# Patient Record
Sex: Male | Born: 1985 | Hispanic: No | Marital: Single | Smoking: Current some day smoker
Health system: Southern US, Community
[De-identification: ages and names within clinical notes are randomized; demographics above are authoritative.]

---

## 2021-08-25 ENCOUNTER — Emergency Department (HOSPITAL_COMMUNITY): Payer: Medicaid - Out of State

## 2021-08-25 ENCOUNTER — Encounter (HOSPITAL_COMMUNITY): Payer: Self-pay

## 2021-08-25 ENCOUNTER — Other Ambulatory Visit: Payer: Self-pay

## 2021-08-25 ENCOUNTER — Emergency Department (HOSPITAL_COMMUNITY)
Admission: EM | Admit: 2021-08-25 | Discharge: 2021-08-25 | Disposition: A | Discharge status: Home / Self Care | Payer: Medicaid - Out of State | Attending: Emergency Medicine | Admitting: Emergency Medicine

## 2021-08-25 DIAGNOSIS — S0083XA Contusion of other part of head, initial encounter: Secondary | ICD-10-CM | POA: Diagnosis not present

## 2021-08-25 DIAGNOSIS — S40011A Contusion of right shoulder, initial encounter: Secondary | ICD-10-CM

## 2021-08-25 DIAGNOSIS — S40012A Contusion of left shoulder, initial encounter: Secondary | ICD-10-CM | POA: Diagnosis not present

## 2021-08-25 DIAGNOSIS — S0512XA Contusion of eyeball and orbital tissues, left eye, initial encounter: Secondary | ICD-10-CM

## 2021-08-25 DIAGNOSIS — S80812A Abrasion, left lower leg, initial encounter: Secondary | ICD-10-CM | POA: Diagnosis not present

## 2021-08-25 DIAGNOSIS — Y92009 Unspecified place in unspecified non-institutional (private) residence as the place of occurrence of the external cause: Secondary | ICD-10-CM | POA: Insufficient documentation

## 2021-08-25 DIAGNOSIS — S0990XA Unspecified injury of head, initial encounter: Secondary | ICD-10-CM | POA: Diagnosis present
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
## 2021-08-25 NOTE — ED Notes (Signed)
Visual Acuity:  Left Eye: 20/20 Right Eye: 20/25 Both: 20/20

## 2022-03-14 ENCOUNTER — Emergency Department (HOSPITAL_COMMUNITY): Payer: Medicaid Other

## 2022-03-14 ENCOUNTER — Emergency Department (HOSPITAL_COMMUNITY)
Admission: EM | Admit: 2022-03-14 | Discharge: 2022-03-14 | Disposition: A | Discharge status: Home / Self Care | Payer: Medicaid Other | Attending: Emergency Medicine | Admitting: Emergency Medicine

## 2022-03-14 ENCOUNTER — Encounter (HOSPITAL_COMMUNITY): Payer: Self-pay | Admitting: Emergency Medicine

## 2022-03-14 ENCOUNTER — Other Ambulatory Visit: Payer: Self-pay

## 2022-03-14 DIAGNOSIS — S81811A Laceration without foreign body, right lower leg, initial encounter: Secondary | ICD-10-CM | POA: Insufficient documentation

## 2022-03-14 DIAGNOSIS — S0101XA Laceration without foreign body of scalp, initial encounter: Secondary | ICD-10-CM | POA: Insufficient documentation

## 2022-03-14 DIAGNOSIS — W19XXXA Unspecified fall, initial encounter: Secondary | ICD-10-CM | POA: Insufficient documentation

## 2022-03-14 DIAGNOSIS — S41111A Laceration without foreign body of right upper arm, initial encounter: Secondary | ICD-10-CM | POA: Diagnosis not present

## 2022-03-14 DIAGNOSIS — S0990XA Unspecified injury of head, initial encounter: Secondary | ICD-10-CM

## 2022-03-14 DIAGNOSIS — S41112A Laceration without foreign body of left upper arm, initial encounter: Secondary | ICD-10-CM | POA: Diagnosis not present

## 2022-03-14 DIAGNOSIS — S81812A Laceration without foreign body, left lower leg, initial encounter: Secondary | ICD-10-CM | POA: Insufficient documentation

## 2022-03-14 NOTE — Discharge Instructions (Signed)
Scan today was reassuring, Tylenol Motrin for pain.  Wash the scratches on regular soap and water, vitamin E oil may help alleviate the scar and provide some relief.  Follow-up with primary if symptoms persist, return to ED if things change or worsen. ?
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
## 2022-08-14 ENCOUNTER — Emergency Department (HOSPITAL_COMMUNITY): Payer: Medicaid Other

## 2022-08-14 ENCOUNTER — Emergency Department (HOSPITAL_COMMUNITY)
Admission: EM | Admit: 2022-08-14 | Discharge: 2022-08-15 | Disposition: A | Discharge status: Home / Self Care | Payer: Medicaid Other | Attending: Student | Admitting: Student

## 2022-08-14 DIAGNOSIS — Y908 Blood alcohol level of 240 mg/100 ml or more: Secondary | ICD-10-CM | POA: Diagnosis not present

## 2022-08-14 DIAGNOSIS — Z23 Encounter for immunization: Secondary | ICD-10-CM | POA: Insufficient documentation

## 2022-08-14 DIAGNOSIS — S79921A Unspecified injury of right thigh, initial encounter: Secondary | ICD-10-CM | POA: Diagnosis present

## 2022-08-14 DIAGNOSIS — S71101A Unspecified open wound, right thigh, initial encounter: Secondary | ICD-10-CM | POA: Diagnosis not present

## 2022-08-14 DIAGNOSIS — F17210 Nicotine dependence, cigarettes, uncomplicated: Secondary | ICD-10-CM | POA: Insufficient documentation

## 2022-08-14 DIAGNOSIS — W3400XA Accidental discharge from unspecified firearms or gun, initial encounter: Secondary | ICD-10-CM | POA: Diagnosis not present

## 2022-08-14 DIAGNOSIS — S8011XA Contusion of right lower leg, initial encounter: Secondary | ICD-10-CM

## 2022-08-14 LAB — CBC
HCT: 36.6 % — ABNORMAL LOW (ref 39.0–52.0)
Hemoglobin: 12 g/dL — ABNORMAL LOW (ref 13.0–17.0)
MCH: 32.6 pg (ref 26.0–34.0)
MCHC: 32.8 g/dL (ref 30.0–36.0)
MCV: 99.5 fL (ref 80.0–100.0)
Platelets: 229 10*3/uL (ref 150–400)
RBC: 3.68 MIL/uL — ABNORMAL LOW (ref 4.22–5.81)
RDW: 12.8 % (ref 11.5–15.5)
WBC: 8.3 10*3/uL (ref 4.0–10.5)
nRBC: 0 % (ref 0.0–0.2)

## 2022-08-14 LAB — COMPREHENSIVE METABOLIC PANEL
ALT: 16 U/L (ref 0–44)
AST: 26 U/L (ref 15–41)
Albumin: 3.3 g/dL — ABNORMAL LOW (ref 3.5–5.0)
Alkaline Phosphatase: 48 U/L (ref 38–126)
Anion gap: 9 (ref 5–15)
BUN: 8 mg/dL (ref 6–20)
CO2: 16 mmol/L — ABNORMAL LOW (ref 22–32)
Calcium: 7.2 mg/dL — ABNORMAL LOW (ref 8.9–10.3)
Chloride: 117 mmol/L — ABNORMAL HIGH (ref 98–111)
Creatinine, Ser: 0.75 mg/dL (ref 0.61–1.24)
GFR, Estimated: >60 mL/min (ref >60)
Glucose, Bld: 94 mg/dL (ref 70–99)
Potassium: 3.1 mmol/L — ABNORMAL LOW (ref 3.5–5.1)
Sodium: 142 mmol/L (ref 135–145)
Total Bilirubin: 0.7 mg/dL (ref 0.3–1.2)
Total Protein: 5.5 g/dL — ABNORMAL LOW (ref 6.5–8.1)

## 2022-08-14 LAB — I-STAT CHEM 8, ED
BUN: 6 mg/dL (ref 6–20)
Calcium, Ion: 0.87 mmol/L — CL (ref 1.15–1.40)
Chloride: 114 mmol/L — ABNORMAL HIGH (ref 98–111)
Creatinine, Ser: 1 mg/dL (ref 0.61–1.24)
Glucose, Bld: 83 mg/dL (ref 70–99)
HCT: 35 % — ABNORMAL LOW (ref 39.0–52.0)
Hemoglobin: 11.9 g/dL — ABNORMAL LOW (ref 13.0–17.0)
Potassium: 2.9 mmol/L — ABNORMAL LOW (ref 3.5–5.1)
Sodium: 146 mmol/L — ABNORMAL HIGH (ref 135–145)
TCO2: 15 mmol/L — ABNORMAL LOW (ref 22–32)

## 2022-08-14 LAB — SAMPLE TO BLOOD BANK

## 2022-08-14 LAB — ETHANOL: Alcohol, Ethyl (B): 286 mg/dL — ABNORMAL HIGH (ref <10)

## 2022-08-14 MED ORDER — IOHEXOL 350 MG/ML SOLN
100.0000 mL | Freq: Once | INTRAVENOUS | Status: AC | PRN
  Administered 2022-08-14: 100 mL via INTRAVENOUS

## 2022-08-14 MED ORDER — CALCIUM GLUCONATE-NACL 1-0.675 GM/50ML-% IV SOLN
1.0000 g | Freq: Once | INTRAVENOUS | Status: AC
  Administered 2022-08-14: 1000 mg via INTRAVENOUS
  Filled 2022-08-14: qty 50

## 2022-08-14 MED ORDER — TETANUS-DIPHTH-ACELL PERTUSSIS 5-2.5-18.5 LF-MCG/0.5 IM SUSY
0.5000 mL | PREFILLED_SYRINGE | Freq: Once | INTRAMUSCULAR | Status: AC
  Administered 2022-08-14: 0.5 mL via INTRAMUSCULAR

## 2022-08-14 MED ORDER — LACTATED RINGERS IV BOLUS
1000.0000 mL | Freq: Once | INTRAVENOUS | Status: AC
  Administered 2022-08-14: 1000 mL via INTRAVENOUS

## 2022-08-14 MED ORDER — LORAZEPAM 2 MG/ML IJ SOLN
INTRAMUSCULAR | Status: AC
  Administered 2022-08-14: 2 mg
  Filled 2022-08-14: qty 1

## 2022-08-14 MED ORDER — ZIPRASIDONE MESYLATE 20 MG IM SOLR
20.0000 mg | Freq: Once | INTRAMUSCULAR | Status: AC
  Administered 2022-08-14: 20 mg via INTRAMUSCULAR

## 2022-08-14 MED ORDER — POTASSIUM CHLORIDE CRYS ER 20 MEQ PO TBCR
40.0000 meq | EXTENDED_RELEASE_TABLET | Freq: Once | ORAL | Status: AC
  Administered 2022-08-15: 40 meq via ORAL
  Filled 2022-08-14: qty 2
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
## 2022-08-14 NOTE — ED Notes (Signed)
Pt refused CT scan

## 2022-08-14 NOTE — ED Notes (Signed)
The pt has not been in restraints since I took over at [employer]
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
## 2022-08-14 NOTE — ED Notes (Signed)
Pt transported to CT with this RN and Evangeline Gula.

## 2022-08-14 NOTE — ED Notes (Signed)
Critical Lab results given to EDP
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
## 2022-08-14 NOTE — ED Notes (Signed)
The pt remains sleeping unless disturbed
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
## 2022-08-14 NOTE — Progress Notes (Signed)
Transition of Care [facility]) - CAGE-AID Screening   Patient Details  Name: Karl Newton MRN: [id number] Date of Birth: November 23, 1986     Hewitt Shorts, RN Trauma Response Nurse Phone Number: [phone] 08/14/2022, 5:30 PM   Clinical Narrative:    CAGE-AID Screening: Substance Abuse Screening unable to be completed due to: : Patient unable to participate (pt has refused to answer initially, then required medication-- sleeping at present)  Have You Ever Felt You Ought to Cut Down on Your Drinking or Drug Use?: No Have People Annoyed You By Critizing Your Drinking Or Drug Use?: No Have You Felt Bad Or Guilty About Your Drinking Or Drug Use?: No Have You Ever Had a Drink or Used Drugs First Thing In The Morning to Steady Your Nerves or to Get Rid of a Hangover?: No CAGE-AID Score: 0

## 2022-08-14 NOTE — ED Notes (Signed)
Pt refused CT- began swearing at staff, trying to get up off stretcher-- returned to treatment room. Dr. Audrie Lia made aware.

## 2022-08-15 MED ORDER — ACETAMINOPHEN 325 MG PO TABS
650.0000 mg | ORAL_TABLET | Freq: Once | ORAL | Status: AC
  Administered 2022-08-15: 650 mg via ORAL
  Filled 2022-08-15: qty 2
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
## 2022-09-08 ENCOUNTER — Encounter (HOSPITAL_COMMUNITY): Payer: Self-pay | Admitting: Emergency Medicine

## 2022-09-08 ENCOUNTER — Emergency Department (HOSPITAL_COMMUNITY)
Admission: EM | Admit: 2022-09-08 | Discharge: 2022-09-09 | Discharge status: Against Medical Advice | Payer: Medicaid Other | Attending: Emergency Medicine | Admitting: Emergency Medicine

## 2022-09-08 ENCOUNTER — Other Ambulatory Visit: Payer: Self-pay

## 2022-09-08 DIAGNOSIS — Z5321 Procedure and treatment not carried out due to patient leaving prior to being seen by health care provider: Secondary | ICD-10-CM | POA: Diagnosis not present

## 2022-09-08 DIAGNOSIS — W3400XA Accidental discharge from unspecified firearms or gun, initial encounter: Secondary | ICD-10-CM | POA: Diagnosis not present

## 2022-09-08 DIAGNOSIS — T148XXA Other injury of unspecified body region, initial encounter: Secondary | ICD-10-CM | POA: Insufficient documentation

## 2022-09-08 LAB — COMPREHENSIVE METABOLIC PANEL
ALT: 17 U/L (ref 0–44)
AST: 24 U/L (ref 15–41)
Albumin: 4 g/dL (ref 3.5–5.0)
Alkaline Phosphatase: 69 U/L (ref 38–126)
Anion gap: 9 (ref 5–15)
BUN: 16 mg/dL (ref 6–20)
CO2: 26 mmol/L (ref 22–32)
Calcium: 9.4 mg/dL (ref 8.9–10.3)
Chloride: 104 mmol/L (ref 98–111)
Creatinine, Ser: 0.93 mg/dL (ref 0.61–1.24)
GFR, Estimated: >60 mL/min (ref >60)
Glucose, Bld: 122 mg/dL — ABNORMAL HIGH (ref 70–99)
Potassium: 4.1 mmol/L (ref 3.5–5.1)
Sodium: 139 mmol/L (ref 135–145)
Total Bilirubin: 0.6 mg/dL (ref 0.3–1.2)
Total Protein: 6.6 g/dL (ref 6.5–8.1)

## 2022-09-08 LAB — CBC
HCT: 38.1 % — ABNORMAL LOW (ref 39.0–52.0)
Hemoglobin: 12.7 g/dL — ABNORMAL LOW (ref 13.0–17.0)
MCH: 33.2 pg (ref 26.0–34.0)
MCHC: 33.3 g/dL (ref 30.0–36.0)
MCV: 99.5 fL (ref 80.0–100.0)
Platelets: 285 10*3/uL (ref 150–400)
RBC: 3.83 MIL/uL — ABNORMAL LOW (ref 4.22–5.81)
RDW: 12.7 % (ref 11.5–15.5)
WBC: 9.1 10*3/uL (ref 4.0–10.5)
nRBC: 0 % (ref 0.0–0.2)
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
## 2022-09-08 NOTE — ED Notes (Signed)
Pt called back for quick dispo again with no response.

## 2022-09-08 NOTE — ED Notes (Signed)
Pt called for quick dispo with no response.  
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
## 2022-10-18 IMAGING — CT CT HEAD W/O CM
3 series · 15 of 47 positions shown, 18 images · non-contrast
Comparison: None.

CLINICAL DATA: Status post fall.



[Series 2: head wo · axial · 0.49mm/px · z∈[-56,+89]mm · 9 of 35 slices shown, 12 images]
[im 3/35  brain]
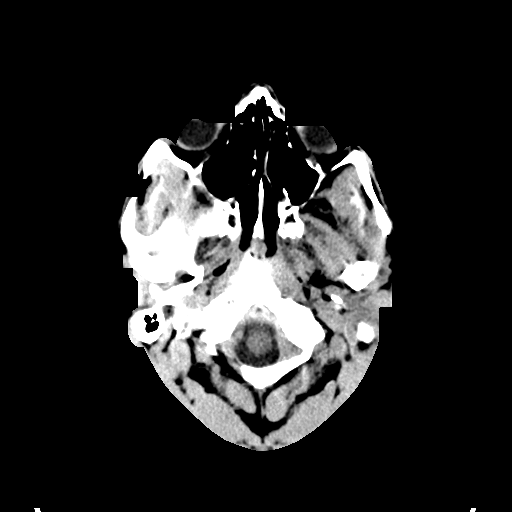
[im 3/35  bone]
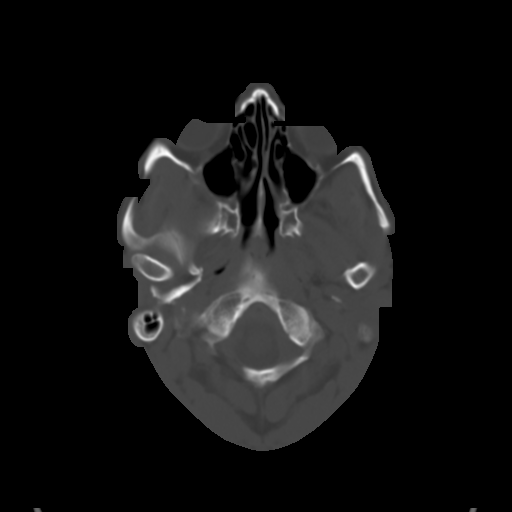
[im 6/35  brain]
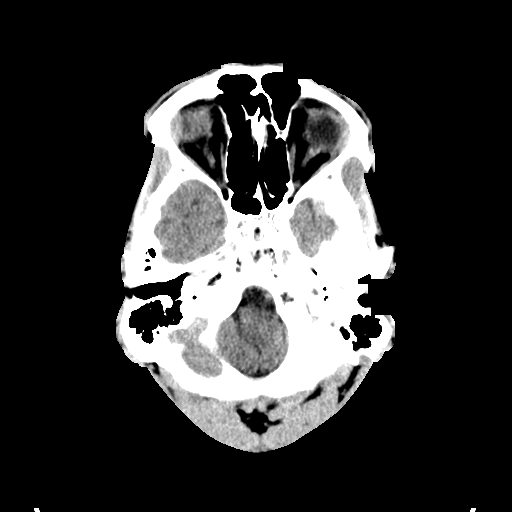
[im 10/35  brain]
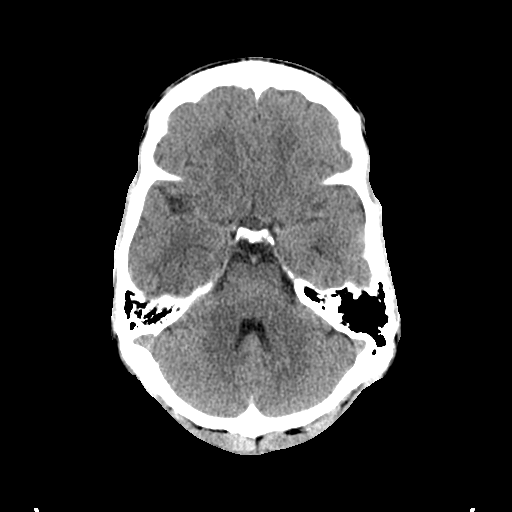
[im 13/35  brain]
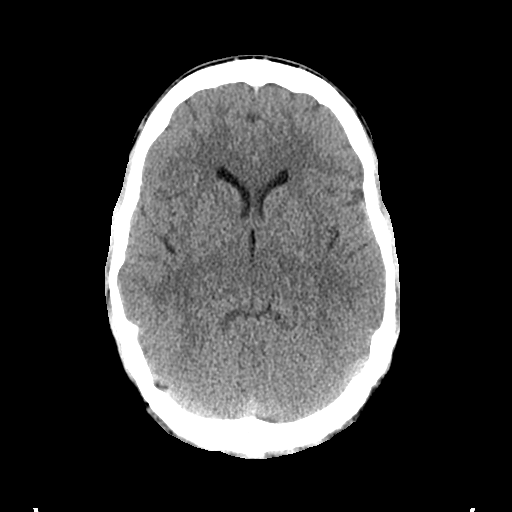
[im 18/35  brain]
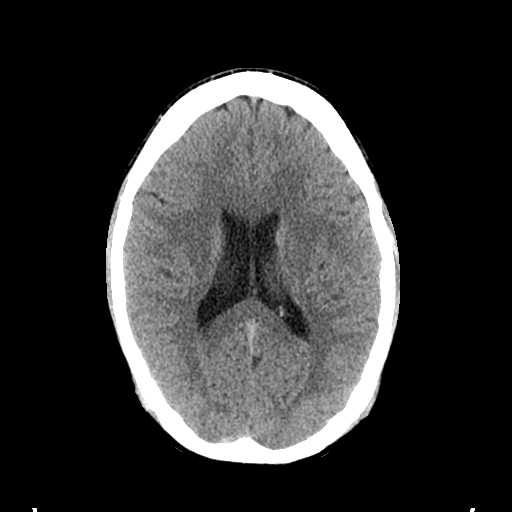
[im 18/35  bone]
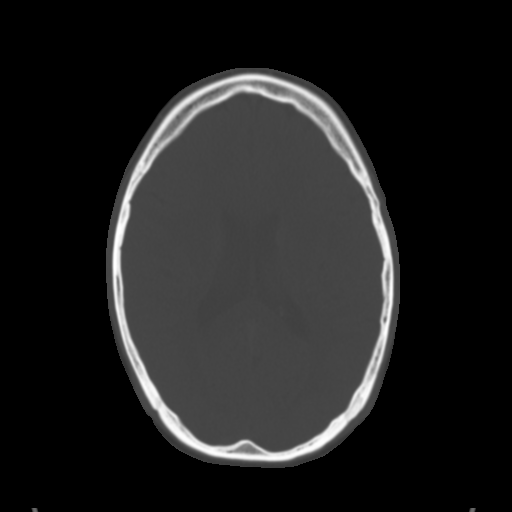
[im 22/35  brain]
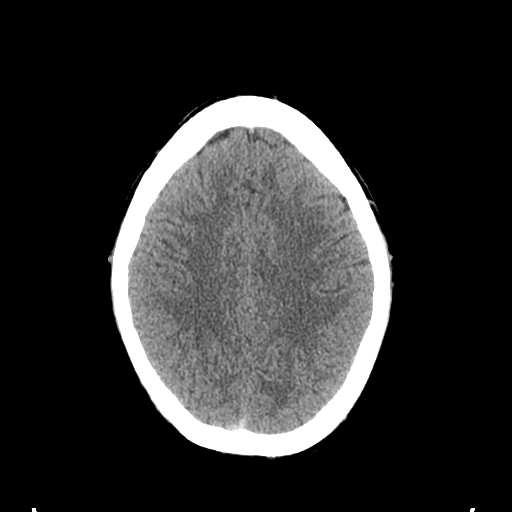
[im 25/35  brain]
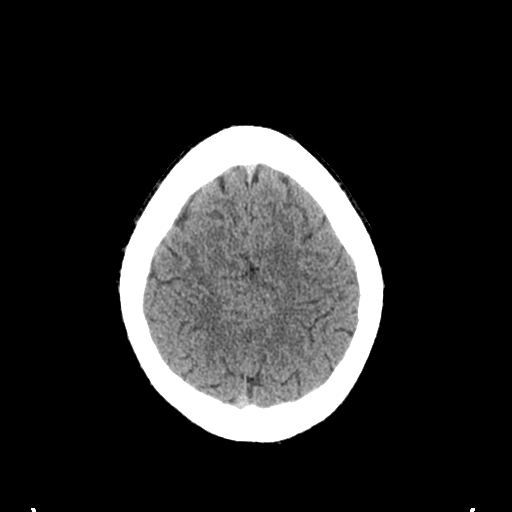
[im 29/35  brain]
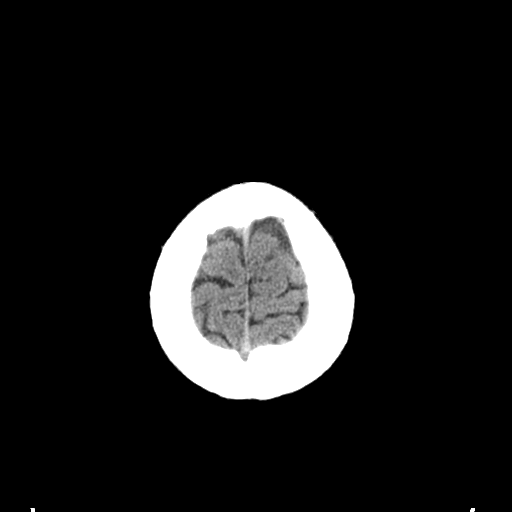
[im 32/35  brain]
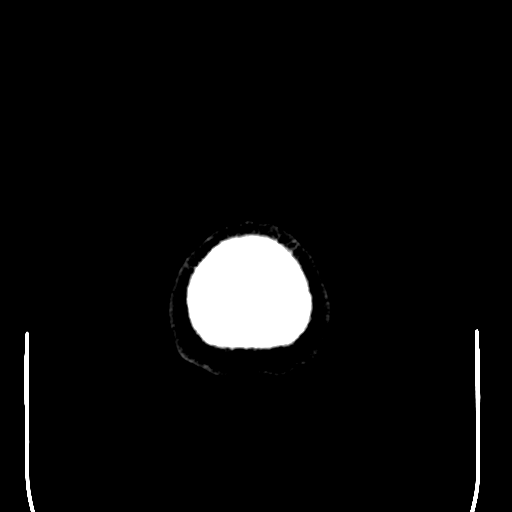
[im 32/35  bone]
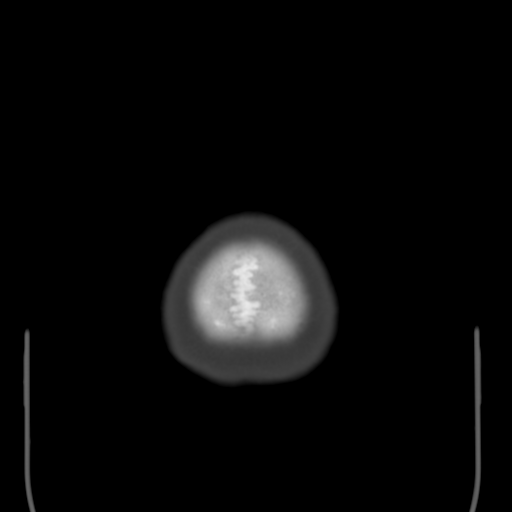

[Series 5: coronal soft tissue · coronal · 0.36mm/px · 3 of 78 slices shown]
[im 26/78  brain]
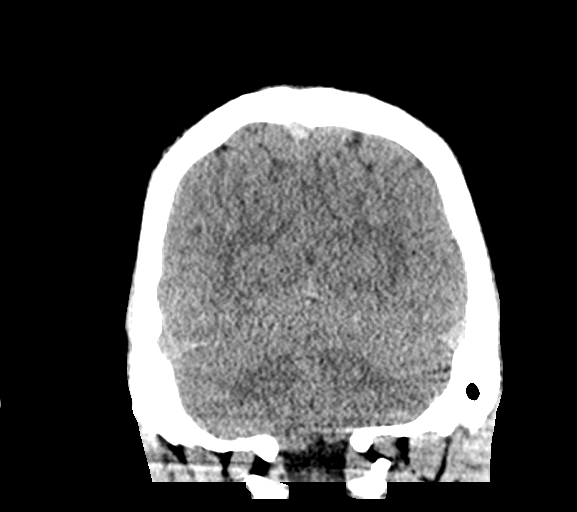
[im 35/78  brain]
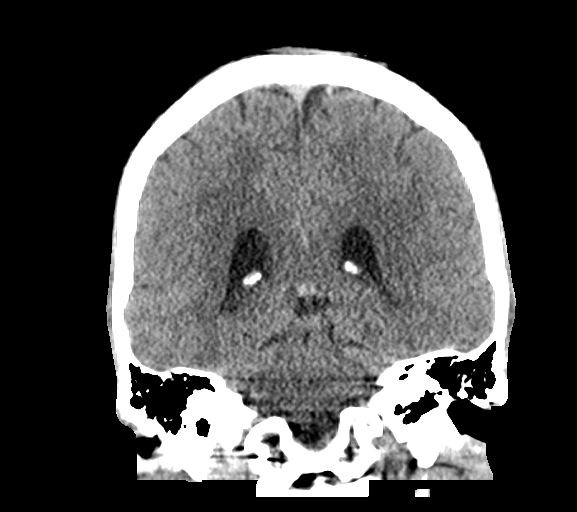
[im 43/78  brain]
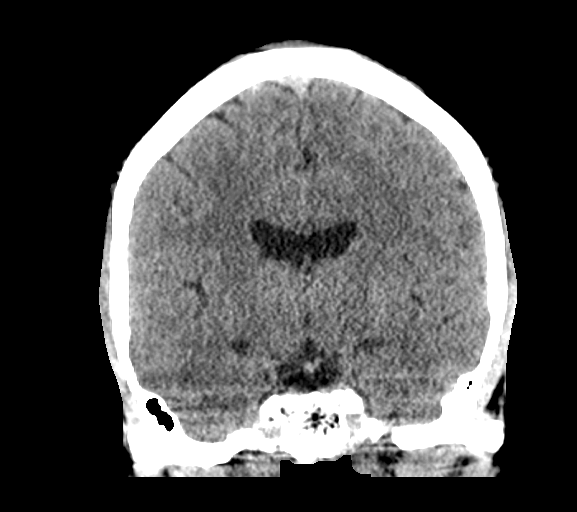

[Series 6: sagittal soft tissue · sagittal · 0.35mm/px · 3 of 58 slices shown]
[im 20/58  brain]
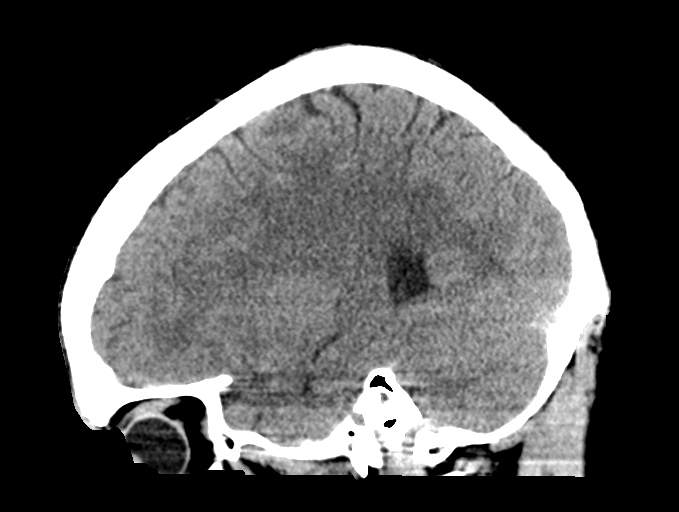
[im 29/58  brain]
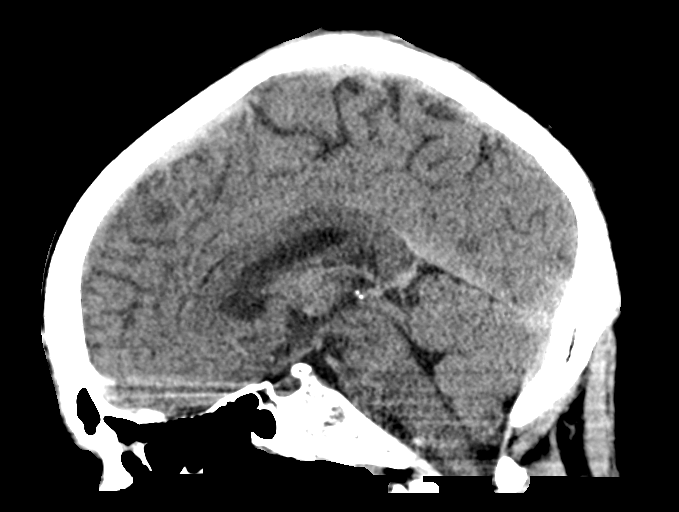
[im 39/58  brain]
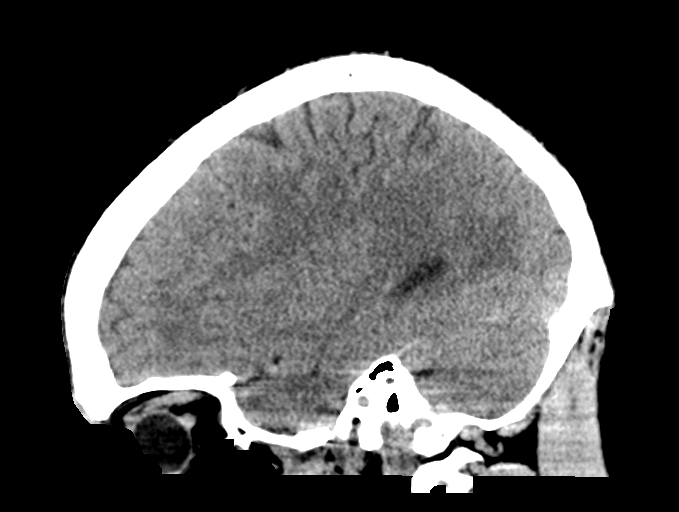

[15 of 47 positions shown; findings below may reference images not displayed]

FINDINGS: Brain: No evidence of acute infarction, hemorrhage, hydrocephalus,
extra-axial collection or mass lesion/mass effect.

Vascular: No hyperdense vessel or unexpected calcification.

Skull: Normal. Negative for fracture or focal lesion.

Sinuses/Orbits: No acute finding.

Other: None.
IMPRESSION: No acute intracranial pathology.

## 2022-12-06 ENCOUNTER — Other Ambulatory Visit: Payer: Self-pay

## 2022-12-06 ENCOUNTER — Emergency Department (HOSPITAL_COMMUNITY)
Admission: EM | Admit: 2022-12-06 | Discharge: 2022-12-06 | Disposition: A | Discharge status: Home / Self Care | Payer: Medicaid Other | Attending: Emergency Medicine | Admitting: Emergency Medicine

## 2022-12-06 DIAGNOSIS — Z5189 Encounter for other specified aftercare: Secondary | ICD-10-CM

## 2022-12-06 DIAGNOSIS — I10 Essential (primary) hypertension: Secondary | ICD-10-CM | POA: Diagnosis not present

## 2022-12-06 DIAGNOSIS — Z4800 Encounter for change or removal of nonsurgical wound dressing: Secondary | ICD-10-CM | POA: Diagnosis not present

## 2022-12-06 DIAGNOSIS — Z79899 Other long term (current) drug therapy: Secondary | ICD-10-CM | POA: Insufficient documentation

## 2022-12-06 MED ORDER — AMLODIPINE BESYLATE 5 MG PO TABS: 5.0000 mg | ORAL_TABLET | Freq: Every day | ORAL | 0 refills | Status: AC

## 2022-12-06 MED ORDER — AMLODIPINE BESYLATE 5 MG PO TABS
5.0000 mg | ORAL_TABLET | Freq: Once | ORAL | Status: AC
  Administered 2022-12-06: 5 mg via ORAL
  Filled 2022-12-06: qty 1
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
## 2022-12-06 NOTE — Discharge Instructions (Addendum)
Start taking the medications for your blood pressure.  Follow-up with a primary care doctor as we discussed.  The wound on your right leg is healing well.  If there is a retained suture below the skin no further treatment is necessary unless you start having some pain discomfort or swelling.

## 2022-12-06 NOTE — ED Notes (Signed)
Discharge instructions reviewed with patient. Patient denies any questions or concerns.   

## 2023-05-18 ENCOUNTER — Other Ambulatory Visit: Payer: Self-pay

## 2023-05-18 ENCOUNTER — Emergency Department (HOSPITAL_COMMUNITY)
Admission: EM | Admit: 2023-05-18 | Discharge: 2023-05-19 | Disposition: A | Discharge status: Home / Self Care | Payer: Medicaid Other | Attending: Emergency Medicine | Admitting: Emergency Medicine

## 2023-05-18 ENCOUNTER — Encounter (HOSPITAL_COMMUNITY): Payer: Self-pay

## 2023-05-18 DIAGNOSIS — R21 Rash and other nonspecific skin eruption: Secondary | ICD-10-CM | POA: Diagnosis present

## 2023-05-18 MED ORDER — IBUPROFEN 800 MG PO TABS
800.0000 mg | ORAL_TABLET | Freq: Once | ORAL | Status: AC
  Administered 2023-05-19: 800 mg via ORAL
  Filled 2023-05-18: qty 1

## 2023-05-18 MED ORDER — NYSTATIN 100000 UNIT/GM EX POWD
Freq: Once | CUTANEOUS | Status: AC
  Filled 2023-05-18: qty 15

## 2023-05-18 MED ORDER — CEPHALEXIN 250 MG PO CAPS
1000.0000 mg | ORAL_CAPSULE | Freq: Once | ORAL | Status: AC
  Administered 2023-05-19: 1000 mg via ORAL
  Filled 2023-05-18: qty 4

## 2023-05-18 MED ORDER — CEPHALEXIN 500 MG PO CAPS: 500.0000 mg | ORAL_CAPSULE | Freq: Four times a day (QID) | ORAL | 0 refills | Status: AC

## 2023-05-18 MED ORDER — NYSTATIN 100000 UNIT/GM EX POWD: 1.0000 | Freq: Three times a day (TID) | CUTANEOUS | 0 refills | Status: AC
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
## 2023-05-18 NOTE — ED Triage Notes (Signed)
Pt arrives via POV c/o rash on bilateral upper, inner thighs. Pt states that the rash showed up after he got sweaty.

## 2023-05-19 NOTE — ED Notes (Signed)
Discharge instructions reviewed with patient. Patient questions answered and opportunity for education reviewed. Patient voices understanding of discharge instructions with no further questions. Patient ambulatory with steady gait to lobby.
